# Patient Record
Sex: Female | Born: 1985 | Race: White | Hispanic: No | Marital: Single | Smoking: Never smoker
Health system: Southern US, Community
[De-identification: ages and names within clinical notes are randomized; demographics above are authoritative.]

## PROBLEM LIST (undated history)

## (undated) DIAGNOSIS — J45909 Unspecified asthma, uncomplicated: Secondary | ICD-10-CM

---

## 2014-10-24 ENCOUNTER — Other Ambulatory Visit (HOSPITAL_BASED_OUTPATIENT_CLINIC_OR_DEPARTMENT_OTHER): Payer: Self-pay | Admitting: Family Medicine

## 2014-10-24 DIAGNOSIS — R198 Other specified symptoms and signs involving the digestive system and abdomen: Secondary | ICD-10-CM

## 2014-10-24 DIAGNOSIS — R109 Unspecified abdominal pain: Secondary | ICD-10-CM

## 2014-10-25 ENCOUNTER — Ambulatory Visit (HOSPITAL_BASED_OUTPATIENT_CLINIC_OR_DEPARTMENT_OTHER)
Admission: RE | Admit: 2014-10-25 | Discharge: 2014-10-25 | Disposition: A | Discharge status: Home / Self Care | Payer: BC Managed Care – PPO | Source: Ambulatory Visit | Attending: Family Medicine | Admitting: Family Medicine

## 2014-10-25 DIAGNOSIS — R198 Other specified symptoms and signs involving the digestive system and abdomen: Secondary | ICD-10-CM

## 2014-10-25 DIAGNOSIS — K5730 Diverticulosis of large intestine without perforation or abscess without bleeding: Secondary | ICD-10-CM | POA: Diagnosis not present

## 2014-10-25 DIAGNOSIS — K8020 Calculus of gallbladder without cholecystitis without obstruction: Secondary | ICD-10-CM | POA: Diagnosis not present

## 2014-10-25 DIAGNOSIS — R109 Unspecified abdominal pain: Secondary | ICD-10-CM | POA: Insufficient documentation

## 2014-10-25 MED ORDER — IOHEXOL 300 MG/ML  SOLN
100.0000 mL | Freq: Once | INTRAMUSCULAR | Status: AC | PRN
  Administered 2014-10-25: 100 mL via INTRAVENOUS

## 2014-11-15 ENCOUNTER — Emergency Department (HOSPITAL_BASED_OUTPATIENT_CLINIC_OR_DEPARTMENT_OTHER)
Admission: EM | Admit: 2014-11-15 | Discharge: 2014-11-16 | Disposition: A | Discharge status: Home / Self Care | Payer: BLUE CROSS/BLUE SHIELD | Attending: Emergency Medicine | Admitting: Emergency Medicine

## 2014-11-15 ENCOUNTER — Encounter (HOSPITAL_BASED_OUTPATIENT_CLINIC_OR_DEPARTMENT_OTHER): Payer: Self-pay | Admitting: *Deleted

## 2014-11-15 DIAGNOSIS — Z79899 Other long term (current) drug therapy: Secondary | ICD-10-CM | POA: Insufficient documentation

## 2014-11-15 DIAGNOSIS — Z3202 Encounter for pregnancy test, result negative: Secondary | ICD-10-CM | POA: Insufficient documentation

## 2014-11-15 DIAGNOSIS — R1011 Right upper quadrant pain: Secondary | ICD-10-CM

## 2014-11-15 HISTORY — DX: Unspecified asthma, uncomplicated: J45.909

## 2014-11-15 LAB — COMPREHENSIVE METABOLIC PANEL
ALBUMIN: 3.8 g/dL (ref 3.5–5.2)
ALT: 31 U/L (ref 0–35)
ANION GAP: 6 (ref 5–15)
AST: 22 U/L (ref 0–37)
Alkaline Phosphatase: 84 U/L (ref 39–117)
BUN: 6 mg/dL (ref 6–23)
CALCIUM: 8.5 mg/dL (ref 8.4–10.5)
CHLORIDE: 106 meq/L (ref 96–112)
CO2: 21 mmol/L (ref 19–32)
Creatinine, Ser: 0.54 mg/dL (ref 0.50–1.10)
GFR calc Af Amer: >90 mL/min (ref >90)
Glucose, Bld: 98 mg/dL (ref 70–99)
Potassium: 3.9 mmol/L (ref 3.5–5.1)
Sodium: 133 mmol/L — ABNORMAL LOW (ref 135–145)
Total Bilirubin: 1.2 mg/dL (ref 0.3–1.2)
Total Protein: 8.2 g/dL (ref 6.0–8.3)

## 2014-11-15 LAB — URINALYSIS, ROUTINE W REFLEX MICROSCOPIC
Bilirubin Urine: NEGATIVE
Glucose, UA: NEGATIVE mg/dL
Ketones, ur: NEGATIVE mg/dL
Nitrite: NEGATIVE
Protein, ur: NEGATIVE mg/dL
Specific Gravity, Urine: 1.012 (ref 1.005–1.030)
Urobilinogen, UA: 1 mg/dL (ref 0.0–1.0)
pH: 6 (ref 5.0–8.0)

## 2014-11-15 LAB — CBC WITH DIFFERENTIAL/PLATELET
BASOS ABS: 0.1 10*3/uL (ref 0.0–0.1)
BASOS PCT: 0 % (ref 0–1)
Eosinophils Absolute: 0.1 10*3/uL (ref 0.0–0.7)
Eosinophils Relative: 1 % (ref 0–5)
HEMATOCRIT: 39.3 % (ref 36.0–46.0)
Hemoglobin: 13.1 g/dL (ref 12.0–15.0)
LYMPHS PCT: 19 % (ref 12–46)
Lymphs Abs: 3.2 10*3/uL (ref 0.7–4.0)
MCH: 29 pg (ref 26.0–34.0)
MCHC: 33.3 g/dL (ref 30.0–36.0)
MCV: 87.1 fL (ref 78.0–100.0)
MONO ABS: 1.3 10*3/uL — AB (ref 0.1–1.0)
Monocytes Relative: 8 % (ref 3–12)
NEUTROS PCT: 72 % (ref 43–77)
Neutro Abs: 12 10*3/uL — ABNORMAL HIGH (ref 1.7–7.7)
Platelets: 329 10*3/uL (ref 150–400)
RBC: 4.51 MIL/uL (ref 3.87–5.11)
RDW: 12.8 % (ref 11.5–15.5)
WBC: 16.6 10*3/uL — ABNORMAL HIGH (ref 4.0–10.5)

## 2014-11-15 LAB — PREGNANCY, URINE: Preg Test, Ur: NEGATIVE

## 2014-11-15 LAB — URINE MICROSCOPIC-ADD ON

## 2014-11-15 LAB — LIPASE, BLOOD: LIPASE: 18 U/L (ref 11–59)

## 2014-11-15 MED ORDER — ONDANSETRON HCL 4 MG/2ML IJ SOLN: 4.0000 mg | Freq: Once | INTRAMUSCULAR | Status: DC

## 2014-11-15 MED ORDER — SODIUM CHLORIDE 0.9 % IV SOLN
1000.0000 mL | Freq: Once | INTRAVENOUS | Status: AC
  Administered 2014-11-15: 1000 mL via INTRAVENOUS

## 2014-11-15 NOTE — ED Notes (Signed)
Pt c/o right lower abd pain x 3 days CT scan with  contrast done 2 weeks ago

## 2014-11-16 ENCOUNTER — Inpatient Hospital Stay (HOSPITAL_BASED_OUTPATIENT_CLINIC_OR_DEPARTMENT_OTHER): Admit: 2014-11-16 | Payer: BLUE CROSS/BLUE SHIELD

## 2014-11-16 MED ORDER — OXYCODONE-ACETAMINOPHEN 5-325 MG PO TABS: 1.0000 | ORAL_TABLET | ORAL | Status: AC | PRN

## 2014-11-16 MED ORDER — ONDANSETRON HCL 4 MG PO TABS: 4.0000 mg | ORAL_TABLET | Freq: Four times a day (QID) | ORAL | Status: AC

## 2014-11-16 NOTE — ED Provider Notes (Signed)
CSN: [id number]     Arrival date & time 11/15/14  2147 History   First MD Initiated Contact with Patient 11/16/14 0114     Chief Complaint  Patient presents with  . Abdominal Pain     (Consider location/radiation/quality/duration/timing/severity/associated sxs/prior Treatment) Patient is a 29 y.o. female presenting with abdominal pain. The history is provided by the patient. No language interpreter was used.  Abdominal Pain Pain location:  RUQ Associated symptoms: no chills, no diarrhea, no dysuria, no fever, no nausea, no vaginal discharge and no vomiting   Associated symptoms comment:  Pain limited to RUQ abdomen that started 3-4 days ago. No N, V or fever. She has a known gall stone found incidentally on CT scan 3 weeks ago when being treated for abdominal wall cellulitis. She denies any alleviating or exacerbating factors, specifically the pain is no worse with eating.    Past Medical History  Diagnosis Date  . Asthma    History reviewed. No pertinent past surgical history. History reviewed. No pertinent family history. History  Substance Use Topics  . Smoking status: Never Smoker   . Smokeless tobacco: Not on file  . Alcohol Use: No   OB History    No data available     Review of Systems  Constitutional: Negative for fever and chills.  HENT: Negative.   Respiratory: Negative.   Cardiovascular: Negative.   Gastrointestinal: Positive for abdominal pain. Negative for nausea, vomiting and diarrhea.  Genitourinary: Negative.  Negative for dysuria and vaginal discharge.  Musculoskeletal: Negative.  Negative for back pain.  Skin: Negative.   Neurological: Negative.       Allergies  Review of patient's allergies indicates no known allergies.  Home Medications   Prior to Admission medications   Medication Sig Start Date End Date Taking? Authorizing Provider  levothyroxine (SYNTHROID, LEVOTHROID) 112 MCG tablet Take 112 mcg by mouth daily before breakfast.   Yes  Historical Provider, MD  metFORMIN (GLUCOPHAGE) 500 MG tablet Take 750 mg by mouth 2 (two) times daily with a meal.   Yes Historical Provider, MD  montelukast (SINGULAIR) 10 MG tablet Take 10 mg by mouth at bedtime.   Yes Historical Provider, MD   LMP 10/25/2014 Physical Exam  Constitutional: She is oriented to person, place, and time. She appears well-developed and well-nourished.  Neck: Normal range of motion.  Pulmonary/Chest: Effort normal.  Abdominal: She exhibits no mass.  ruq abdominal tenderness.  Neurological: She is alert and oriented to person, place, and time.  Skin: Skin is warm and dry.    ED Course  Procedures (including critical care time) Labs Review Labs Reviewed  URINALYSIS, ROUTINE W REFLEX MICROSCOPIC - Abnormal; Notable for the following:    Color, Urine AMBER (*)    APPearance CLOUDY (*)    Hgb urine dipstick SMALL (*)    Leukocytes, UA MODERATE (*)    All other components within normal limits  URINE MICROSCOPIC-ADD ON - Abnormal; Notable for the following:    Squamous Epithelial / LPF MANY (*)    Bacteria, UA MANY (*)    All other components within normal limits  CBC WITH DIFFERENTIAL - Abnormal; Notable for the following:    WBC 16.6 (*)    Neutro Abs 12.0 (*)    Monocytes Absolute 1.3 (*)    All other components within normal limits  COMPREHENSIVE METABOLIC PANEL - Abnormal; Notable for the following:    Sodium 133 (*)    All other components within normal limits  URINE CULTURE  PREGNANCY, URINE  LIPASE, BLOOD    Imaging Review No results found.   EKG Interpretation None      MDM   Final diagnoses:  None    1. Abdominal pain 2. History of gall stones  No fever, no change in LFT's. She has a leukocytosis without shift. She is currently comfortable. She will need an ultrasound which is not available here tonight but feel she is stable enough for outpatient study in am. Will schedule study and she is in agreement to return for study.  Return precautions discussed.    Arnoldo HookerShari A Amaris Delafuente, PA-C 11/16/14 0140  Hanley SeamenJohn L Molpus, MD 11/16/14 [phone]

## 2014-11-16 NOTE — Discharge Instructions (Signed)
Abdominal Pain  Many things can cause abdominal pain. Usually, abdominal pain is not caused by a disease and will improve without treatment. It can often be observed and treated at home. Your health care provider will do a physical exam and possibly order blood tests and X-rays to help determine the seriousness of your pain. However, in many cases, more time must pass before a clear cause of the pain can be found. Before that point, your health care provider may not know if you need more testing or further treatment.  HOME CARE INSTRUCTIONS   Monitor your abdominal pain for any changes. The following actions may help to alleviate any discomfort you are experiencing:   Only take over-the-counter or prescription medicines as directed by your health care provider.   Do not take laxatives unless directed to do so by your health care provider.   Try a clear liquid diet (broth, tea, or water) as directed by your health care provider. Slowly move to a bland diet as tolerated.  SEEK MEDICAL CARE IF:   You have unexplained abdominal pain.   You have abdominal pain associated with nausea or diarrhea.   You have pain when you urinate or have a bowel movement.   You experience abdominal pain that wakes you in the night.   You have abdominal pain that is worsened or improved by eating food.   You have abdominal pain that is worsened with eating fatty foods.   You have a fever.  SEEK IMMEDIATE MEDICAL CARE IF:    Your pain does not go away within 2 hours.   You keep throwing up (vomiting).   Your pain is felt only in portions of the abdomen, such as the right side or the left lower portion of the abdomen.   You pass bloody or black tarry stools.  MAKE SURE YOU:   Understand these instructions.    Will watch your condition.    Will get help right away if you are not doing well or get worse.   Document Released: 07/22/2005 Document Revised: 10/17/2013 Document Reviewed: 06/21/2013  [employer] Patient Information  2015 [employer]. This information is not intended to replace advice given to you by your health care provider. Make sure you discuss any questions you have with your health care provider.          Biliary Colic   Biliary colic is a steady or irregular pain in the upper abdomen. It is usually under the right side of the rib cage. It happens when gallstones interfere with the normal flow of bile from the gallbladder. Bile is a liquid that helps to digest fats. Bile is made in the liver and stored in the gallbladder. When you eat a meal, bile passes from the gallbladder through the cystic duct and the common bile duct into the small intestine. There, it mixes with partially digested food. If a gallstone blocks either of these ducts, the normal flow of bile is blocked. The muscle cells in the bile duct contract forcefully to try to move the stone. This causes the pain of biliary colic.   SYMPTOMS    A person with biliary colic usually complains of pain in the upper abdomen. This pain can be:   In the center of the upper abdomen just below the breastbone.   In the upper-right part of the abdomen, near the gallbladder and liver.   Spread back toward the right shoulder blade.   Nausea and vomiting.   The pain   usually occurs after eating.   Biliary colic is usually triggered by the digestive system's demand for bile. The demand for bile is high after fatty meals. Symptoms can also occur when a person who has been fasting suddenly eats a very large meal. Most episodes of biliary colic pass after 1 to 5 hours. After the most intense pain passes, your abdomen may continue to ache mildly for about 24 hours.  DIAGNOSIS   After you describe your symptoms, your caregiver will perform a physical exam. He or she will pay attention to the upper right portion of your belly (abdomen). This is the area of your liver and gallbladder. An ultrasound will help your caregiver look for gallstones. Specialized scans of the  gallbladder may also be done. Blood tests may be done, especially if you have fever or if your pain persists.  PREVENTION   Biliary colic can be prevented by controlling the risk factors for gallstones. Some of these risk factors, such as heredity, increasing age, and pregnancy are a normal part of life. Obesity and a high-fat diet are risk factors you can change through a healthy lifestyle. Women going through menopause who take hormone replacement therapy (estrogen) are also more likely to develop biliary colic.  TREATMENT    Pain medication may be prescribed.   You may be encouraged to eat a fat-free diet.   If the first episode of biliary colic is severe, or episodes of colic keep retuning, surgery to remove the gallbladder (cholecystectomy) is usually recommended. This procedure can be done through small incisions using an instrument called a laparoscope. The procedure often requires a brief stay in the hospital. Some people can leave the hospital the same day. It is the most widely used treatment in people troubled by painful gallstones. It is effective and safe, with no complications in more than 90% of cases.   If surgery cannot be done, medication that dissolves gallstones may be used. This medication is expensive and can take months or years to work. Only small stones will dissolve.   Rarely, medication to dissolve gallstones is combined with a procedure called shock-wave lithotripsy. This procedure uses carefully aimed shock waves to break up gallstones. In many people treated with this procedure, gallstones form again within a few years.  PROGNOSIS   If gallstones block your cystic duct or common bile duct, you are at risk for repeated episodes of biliary colic. There is also a 25% chance that you will develop a gallbladder infection(acute cholecystitis), or some other complication of gallstones within 10 to 20 years. If you have surgery, schedule it at a time that is convenient for you and at a  time when you are not sick.  HOME CARE INSTRUCTIONS    Drink plenty of clear fluids.   Avoid fatty, greasy or fried foods, or any foods that make your pain worse.   Take medications as directed.  SEEK MEDICAL CARE IF:    You develop a fever over 100.5 F (38.1 C).   Your pain gets worse over time.   You develop nausea that prevents you from eating and drinking.   You develop vomiting.  SEEK IMMEDIATE MEDICAL CARE IF:    You have continuous or severe belly (abdominal) pain which is not relieved with medications.   You develop nausea and vomiting which is not relieved with medications.   You have symptoms of biliary colic and you suddenly develop a fever and shaking chills. This may signal cholecystitis. Call your caregiver   immediately.   You develop a yellow color to your skin or the white part of your eyes (jaundice).  Document Released: 03/15/2006 Document Revised: 01/04/2012 Document Reviewed: 05/24/2008  [employer] Patient Information 2015 [employer]. This information is not intended to replace advice given to you by your health care provider. Make sure you discuss any questions you have with your health care provider.

## 2014-11-17 LAB — URINE CULTURE: Colony Count: 40000

## 2014-11-19 ENCOUNTER — Other Ambulatory Visit (HOSPITAL_BASED_OUTPATIENT_CLINIC_OR_DEPARTMENT_OTHER): Payer: Self-pay | Admitting: Emergency Medicine

## 2014-11-19 ENCOUNTER — Ambulatory Visit (HOSPITAL_BASED_OUTPATIENT_CLINIC_OR_DEPARTMENT_OTHER)
Admission: RE | Admit: 2014-11-19 | Discharge: 2014-11-19 | Disposition: A | Discharge status: Home / Self Care | Payer: BLUE CROSS/BLUE SHIELD | Source: Ambulatory Visit | Attending: Emergency Medicine | Admitting: Emergency Medicine

## 2014-11-19 DIAGNOSIS — R932 Abnormal findings on diagnostic imaging of liver and biliary tract: Secondary | ICD-10-CM | POA: Diagnosis not present

## 2014-11-19 DIAGNOSIS — R1011 Right upper quadrant pain: Secondary | ICD-10-CM | POA: Diagnosis present

## 2014-11-19 DIAGNOSIS — R109 Unspecified abdominal pain: Secondary | ICD-10-CM

## 2014-11-19 DIAGNOSIS — K8020 Calculus of gallbladder without cholecystitis without obstruction: Secondary | ICD-10-CM | POA: Diagnosis not present

## 2014-11-27 ENCOUNTER — Other Ambulatory Visit (INDEPENDENT_AMBULATORY_CARE_PROVIDER_SITE_OTHER): Payer: Self-pay | Admitting: Surgery

## 2014-11-27 NOTE — H&P (Signed)
Amy Munoz 11/27/2014 4:20 PM Location: [facility] Patient #: [id number] DOB: [birth year] Single / Language: [place] / Race: White Female History of Present Illness Amy Munoz(Amy Munoz C. Dawit Tankard MD; 11/27/2014 5:41 PM) Patient words: gallbladder.  The patient is a 29 year old female who presents for evaluation of gall stones. [facility] by [facility] room physician, Amy Munoz. Concern for symptomatic gallstones. Pleasant morbidly obese female. Awoke with sharp sided right upper quadrant abdominal pain. Persisted. Went to emergency room 2 days later. Elevated white count. This of workup otherwise negative. Ultrasound done 2 days later. Showed some gallbladder wall thickening and confirmed a very large gallstone. She had a CAT scan done the month before for concern of some drainage at her bellybutton. Large gallstone noted then. No cholecystitis then. Because of lack of other differential diagnosis, surgical consultation made for possible chronic cholecystitis.   She says she normally can eat most anything she wants. Mildly decreased appetite but no severe nausea or vomiting. Denies heartburn or reflux. No Crohn's. No ulcerative colitis. No sick contacts. No travel history. No hematochezia. No melena. No hematemesis. No history of hepatitis or pancreatitis. Can walk 3 miles without any difficulty. She's never had abdominal surgery. Normally has a bowel movement every day. No family history of bowel issues.    CLINICAL DATA: Six day history of right upper quadrant pain EXAM: ULTRASOUND ABDOMEN COMPLETE COMPARISON: CT abdomen and pelvis October 25, 2014 FINDINGS: Gallbladder: Within the gallbladder, there is a 2.4 cm echogenic focus which shadows, adherent in the neck of the gallbladder. Sludge is also noted in the gallbladder. The gallbladder wall is thickened measuring 5 mm. There is no appreciable pericholecystic fluid. No sonographic Murphy  sign noted. Common bile duct: Diameter: 4 mm. There is no intrahepatic, common hepatic, or common bile duct dilatation. Liver: No focal lesion identified. Liver echogenicity is increased with overall inhomogeneity to the echotexture of the liver. IVC: No abnormality visualized. Pancreas: Visualized portion unremarkable. Portions of pancreas are obscured by gas. Spleen: Size and appearance within normal limits. Right Kidney: Length: 11.6 cm. Echogenicity within normal limits. No mass or hydronephrosis visualized. Left Kidney: Length: 11.2 cm. Echogenicity within normal limits. No mass or hydronephrosis visualized. Abdominal aorta: No aneurysm visualized. Other findings: No demonstrable ascites. IMPRESSION: Cholelithiasis and sludge in gallbladder. Gallbladder wall thickened. Suspect a degree of acute cholecystitis. Portions of pancreas obscured by gas. Visualized portions of pancreas appear normal. Liver echogenicity is increased and somewhat inhomogeneous. Suspect hepatic steatosis. While no focal liver lesions are identified, it must be cautioned that the sensitivity of ultrasound for focal liver lesions is diminished in this circumstance. Electronically Signed By: Amy Munoz. On: 11/19/2014 09:10  Other Problems Amy Munoz(Amy Munoz, CMA; 11/27/2014 4:20 PM) Asthma Cholelithiasis  Past Surgical History Amy Munoz(Amy Munoz, CMA; 11/27/2014 4:20 PM) Oral Surgery  Diagnostic Studies History Amy Munoz(Amy Munoz, CMA; 11/27/2014 4:20 PM) Colonoscopy never Mammogram never Pap Smear 1-5 years ago  Allergies Amy Munoz(Amy Munoz, CMA; 11/27/2014 4:21 PM) No Known Drug Allergies 11/27/2014  Medication History (Amy Munoz, CMA; 11/27/2014 4:22 PM) Synthroid (112MCG Tablet, Oral) Active. Montelukast Sodium (10MG  Tablet, Oral) Active. Zolpidem Tartrate (10MG  Tablet, Oral) Active.  Social History Amy Munoz(Amy Munoz, CMA; 11/27/2014 4:20 PM) Alcohol use Occasional alcohol use. No drug  use Tobacco use Never smoker.  Family History Amy Munoz(Amy Munoz, CMA; 11/27/2014 4:20 PM) Depression Sister. Hypertension Father.  Pregnancy / Birth History Amy Munoz(Amy Munoz, CMA; 11/27/2014 4:20 PM) Age at menarche 12 years. Contraceptive History Oral contraceptives.  Gravida 0 Para 0 Regular periods     Review of Systems (Amy Munoz CMA; 11/27/2014 4:20 PM) General Not Present- Appetite Loss, Chills, Fatigue, Fever, Night Sweats, Weight Gain and Weight Loss. Skin Present- Dryness. Not Present- Change in Wart/Mole, Hives, Jaundice, New Lesions, Non-Healing Wounds, Rash and Ulcer. HEENT Present- Seasonal Allergies. Not Present- Earache, Hearing Loss, Hoarseness, Nose Bleed, Oral Ulcers, Ringing in the Ears, Sinus Pain, Sore Throat, Visual Disturbances, Wears glasses/contact lenses and Yellow Eyes. Respiratory Not Present- Bloody sputum, Chronic Cough, Difficulty Breathing, Snoring and Wheezing. Breast Not Present- Breast Mass, Breast Pain, Nipple Discharge and Skin Changes. Cardiovascular Not Present- Chest Pain, Difficulty Breathing Lying Down, Leg Cramps, Palpitations, Rapid Heart Rate, Shortness of Breath and Swelling of Extremities. Gastrointestinal Not Present- Abdominal Pain, Bloating, Bloody Stool, Change in Bowel Habits, Chronic diarrhea, Constipation, Difficulty Swallowing, Excessive gas, Gets full quickly at meals, Hemorrhoids, Indigestion, Nausea, Rectal Pain and Vomiting. Female Genitourinary Not Present- Frequency, Nocturia, Painful Urination, Pelvic Pain and Urgency. Musculoskeletal Not Present- Back Pain, Joint Pain, Joint Stiffness, Muscle Pain, Muscle Weakness and Swelling of Extremities. Neurological Not Present- Decreased Memory, Fainting, Headaches, Numbness, Seizures, Tingling, Tremor, Trouble walking and Weakness. Psychiatric Not Present- Anxiety, Bipolar, Change in Sleep Pattern, Depression, Fearful and Frequent crying. Endocrine Not Present- Cold Intolerance, Excessive  Hunger, Hair Changes, Heat Intolerance, Hot flashes and New Diabetes. Hematology Not Present- Easy Bruising, Excessive bleeding, Gland problems, HIV and Persistent Infections.  Vitals (Amy Munoz CMA; 11/27/2014 4:21 PM) 11/27/2014 4:21 PM Weight: 263 lb Height: 64in Body Surface Area: 2.32 m Body Mass Index: 45.14 kg/m Temp.: 97.77F(Temporal)  Pulse: 78 (Regular)  BP: 128/76 (Sitting, Left Arm, Standard)     Physical Exam Amy Sportsman MD; 11/27/2014 5:02 PM)  General Mental Status-Alert. General Appearance-Not in acute distress, Not Sickly. Orientation-Oriented X3. Hydration-Well hydrated. Voice-Normal.  Integumentary Global Assessment Upon inspection and palpation of skin surfaces of the - Axillae: non-tender, no inflammation or ulceration, no drainage. and Distribution of scalp and body hair is normal. General Characteristics Temperature - normal warmth is noted.  Head and Neck Head-normocephalic, atraumatic with no lesions or palpable masses. Face Global Assessment - atraumatic, no absence of expression. Neck Global Assessment - no abnormal movements, no bruit auscultated on the right, no bruit auscultated on the left, no decreased range of motion, non-tender. Trachea-midline. Thyroid Gland Characteristics - non-tender.  Eye Eyeball - Left-Extraocular movements intact, No Nystagmus. Eyeball - Right-Extraocular movements intact, No Nystagmus. Cornea - Left-No Hazy. Cornea - Right-No Hazy. Sclera/Conjunctiva - Left-No scleral icterus, No Discharge. Sclera/Conjunctiva - Right-No scleral icterus, No Discharge. Pupil - Left-Direct reaction to light normal. Pupil - Right-Direct reaction to light normal.  ENMT Ears Pinna - Left - no drainage observed, no generalized tenderness observed. Right - no drainage observed, no generalized tenderness observed. Nose and Sinuses External Inspection of the Nose - no destructive lesion  observed. Inspection of the nares - Left - quiet respiration. Right - quiet respiration. Mouth and Throat Lips - Upper Lip - no fissures observed, no pallor noted. Lower Lip - no fissures observed, no pallor noted. Nasopharynx - no discharge present. Oral Cavity/Oropharynx - Tongue - no dryness observed. Oral Mucosa - no cyanosis observed. Hypopharynx - no evidence of airway distress observed.  Chest and Lung Exam Inspection Movements - Normal and Symmetrical. Accessory muscles - No use of accessory muscles in breathing. Palpation Palpation of the chest reveals - Non-tender. Auscultation Breath sounds - Normal and Clear.  Cardiovascular Auscultation Rhythm - Regular. Murmurs &  Other Heart Sounds - Auscultation of the heart reveals - No Murmurs and No Systolic Clicks.  Abdomen Inspection Inspection of the abdomen reveals - No Visible peristalsis and No Abnormal pulsations. Umbilicus - No Bleeding, No Urine drainage. Palpation/Percussion Palpation and Percussion of the abdomen reveal - Soft, Non Tender, No Rebound tenderness, No Rigidity (guarding) and No Cutaneous hyperesthesia. Note: Morbidly obese but soft. No abdominal discomfort. No evidence of cellulitis or irritation at umbilicus. No evidence of umbilical hernia.   Female Genitourinary Sexual Maturity Tanner 5 - Adult hair pattern. Note: No vaginal bleeding nor discharge. No inguinal hernias   Peripheral Vascular Upper Extremity Inspection - Left - No Cyanotic nailbeds, Not Ischemic. Right - No Cyanotic nailbeds, Not Ischemic.  Neurologic Neurologic evaluation reveals -normal attention span and ability to concentrate, able to name objects and repeat phrases. Appropriate fund of knowledge , normal sensation and normal coordination. Mental Status Affect - not angry, not paranoid. Cranial Nerves-Normal Bilaterally. Gait-Normal.  Neuropsychiatric Mental status exam performed with findings of-able to articulate  well with normal speech/language, rate, volume and coherence, thought content normal with ability to perform basic computations and apply abstract reasoning and no evidence of hallucinations, delusions, obsessions or homicidal/suicidal ideation.  Musculoskeletal Global Assessment Spine, Ribs and Pelvis - no instability, subluxation or laxity. Right Upper Extremity - no instability, subluxation or laxity.  Lymphatic Head & Neck  General Head & Neck Lymphatics: Bilateral - Description - No Localized lymphadenopathy. Axillary  General Axillary Region: Bilateral - Description - No Localized lymphadenopathy. Femoral & Inguinal  Generalized Femoral & Inguinal Lymphatics: Left - Description - No Localized lymphadenopathy. Right - Description - No Localized lymphadenopathy.    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 Very large gallstone with some gallbladder wall thickening. Had leukocytosis at that time. Pain resolved now. Still, not a classic description of biliary colic. However, differential diagnosis seems otherwise unlikely. Patient wants to avoid any other possible attacks in the aggressive and proceed with surgery. Reasonable to start with single site technique with possible shift to multiport.  The anatomy & physiology of hepatobiliary & pancreatic function was discussed. The pathophysiology of gallbladder dysfunction was discussed. Natural history risks without surgery was discussed. I feel the risks of no intervention will lead to serious problems that outweigh the operative risks; therefore, I recommended cholecystectomy to remove the pathology. I explained laparoscopic techniques with possible need for an open approach. Probable cholangiogram to evaluate the bilary tract was explained as well.  Risks such as bleeding, infection,  abscess, leak, injury to other organs, need for further treatment, heart attack, death, and other risks were discussed. I noted a good likelihood this will help address the problem. Possibility that this will not correct all abdominal symptoms was explained. Goals of post-operative recovery were discussed as well. We will work to minimize complications. An educational handout further explaining the pathology and treatment options was given as well. Questions were answered. The patient expresses understanding & wishes to proceed with surgery.  Current Plans Schedule for Surgery The anatomy & physiology of hepatobiliary & pancreatic function was discussed. The pathophysiology of gallbladder dysfunction was discussed. Natural history risks without surgery was discussed. I feel the risks of no intervention will lead to serious problems that outweigh the operative risks; therefore, I recommended cholecystectomy to remove the pathology. I explained laparoscopic techniques with possible need for an  open approach. Probable cholangiogram to evaluate the bilary tract was explained as well.  Risks such as bleeding, infection, abscess, leak, injury to other organs, need for further treatment, heart attack, death, and other risks were discussed. I noted a good likelihood this will help address the problem. Possibility that this will not correct all abdominal symptoms was explained. Goals of post-operative recovery were discussed as well. We will work to minimize complications. An educational handout further explaining the pathology and treatment options was given as well. Questions were answered. The patient expresses understanding & wishes to proceed with surgery. Pt Education - [facility] Laparosopic Post Op HCI (Ash Mcelwain) Pt Education - [facility] Pain Control (Adline Kirshenbaum) Pt Education - [facility] Good Bowel Health (Jezreel Sisk)  Amy Munoz, Munoz., F.A.C.S. Gastrointestinal and Minimally Invasive Surgery [facility] [address], [address] [place], [place] [address] [phone] Main / Paging

## 2015-10-09 IMAGING — CT CT ABD-PELV W/ CM
2 of 4 series · 16 of 46 positions shown, 18 images · IV contrast (omnipaque)
Comparison: None.

CLINICAL DATA: Abdominal pain primarily paraumbilical

EXAM:
CT ABDOMEN AND PELVIS WITH CONTRAST
TECHNIQUE: Multidetector CT imaging of the abdomen and pelvis was performed
using the standard protocol following bolus administration of
intravenous contrast.
CONTRAST:  100mL OMNIPAQUE IOHEXOL 300 MG/ML  SOLN

[Series 2: abd/pelvis 5.0 b31f · axial · 0.91mm/px · z∈[+800,+1220]mm · 13 of 92 slices shown, 15 images]
[im 4/92  soft-tissue]
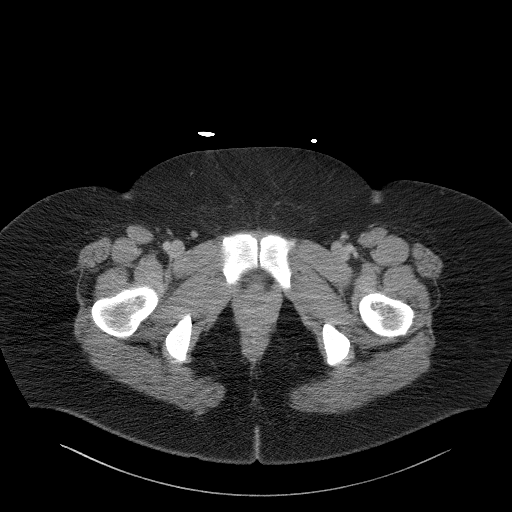
[im 4/92  bone]
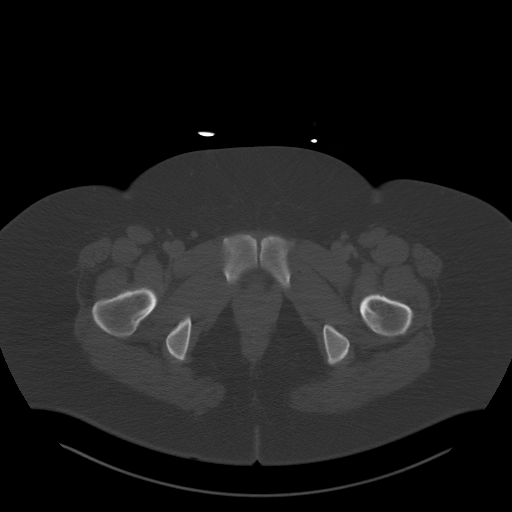
[im 12/92  soft-tissue]
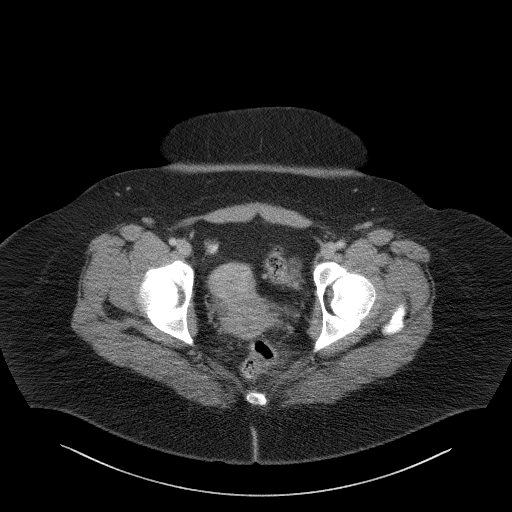
[im 19/92  soft-tissue]
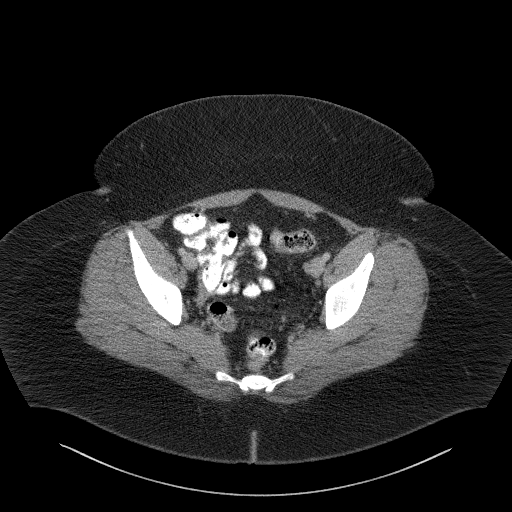
[im 27/92  soft-tissue]
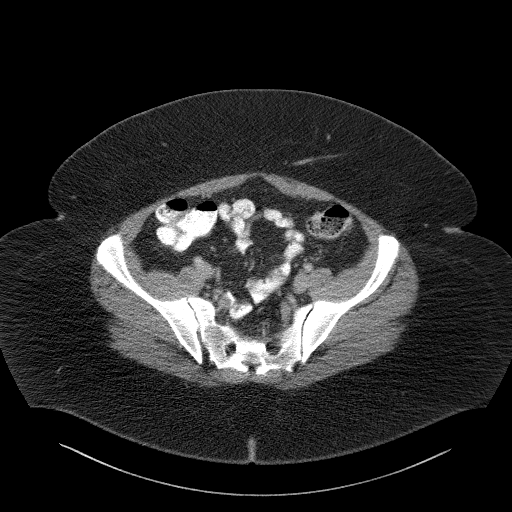
[im 31/92  soft-tissue]
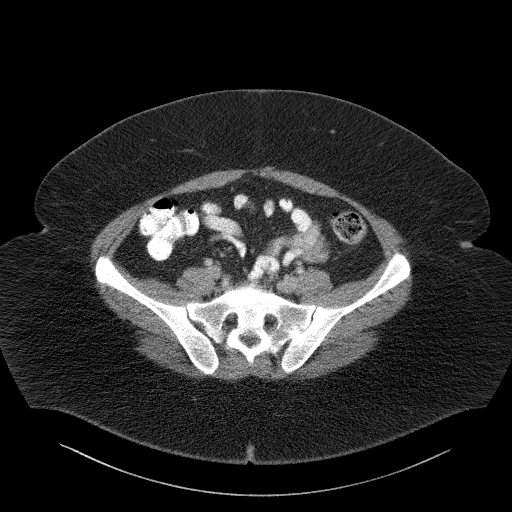
[im 38/92  soft-tissue]
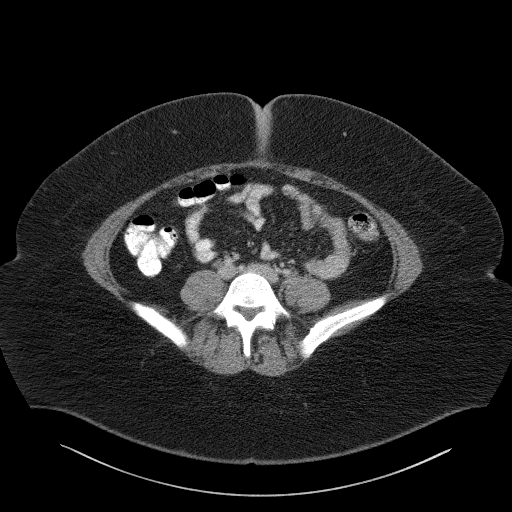
[im 46/92  soft-tissue]
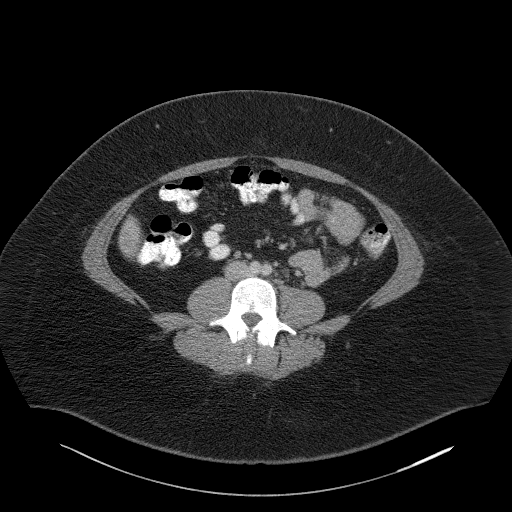
[im 54/92  soft-tissue]
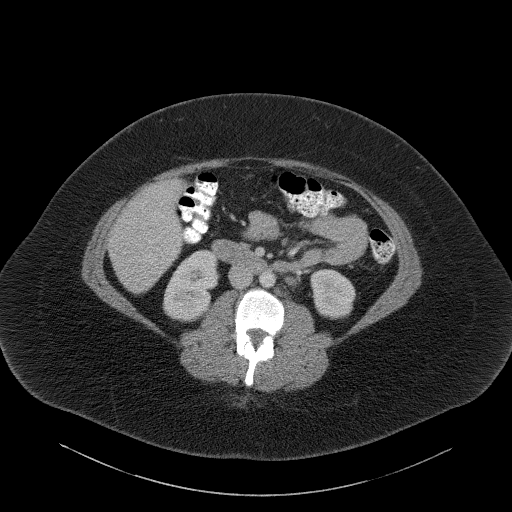
[im 61/92  soft-tissue]
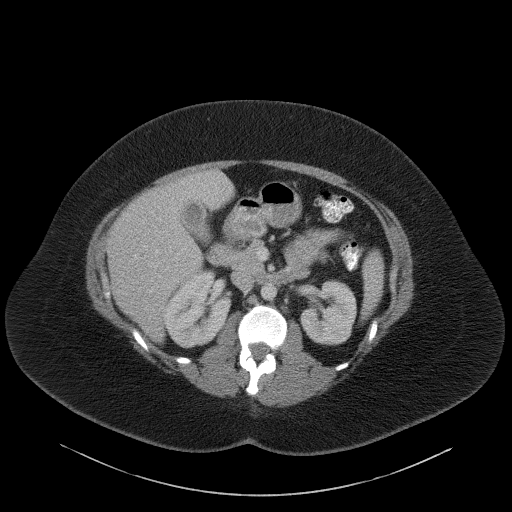
[im 61/92  bone]
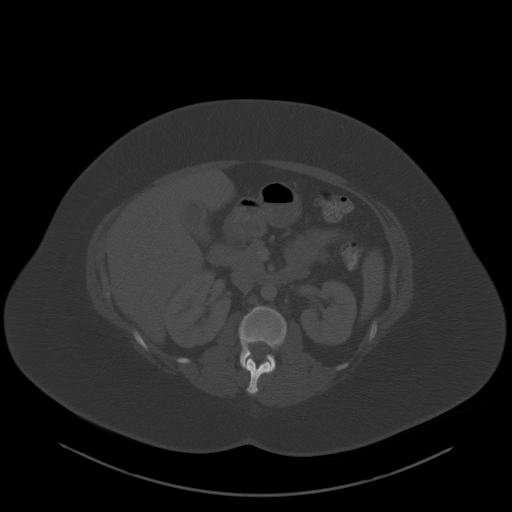
[im 65/92  soft-tissue]
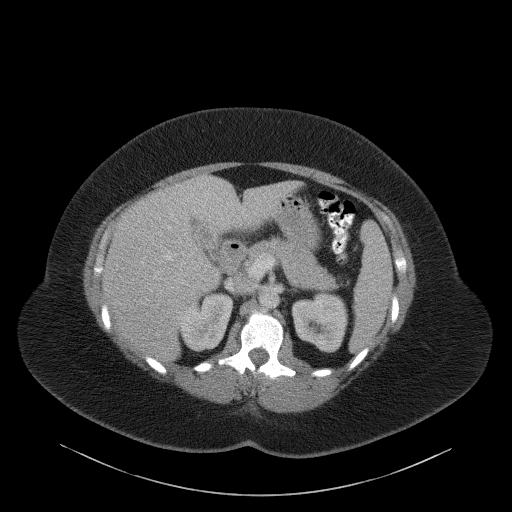
[im 73/92  soft-tissue]
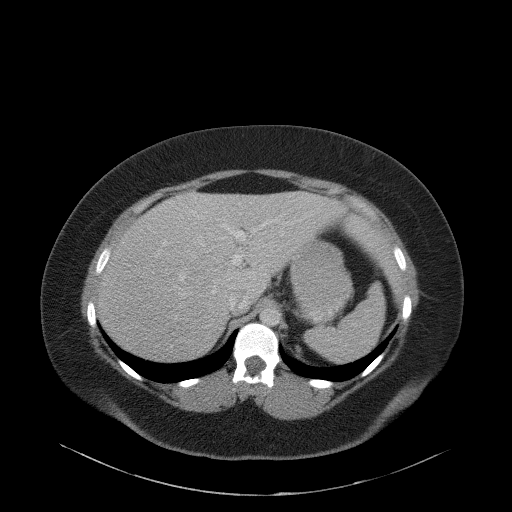
[im 80/92  soft-tissue]
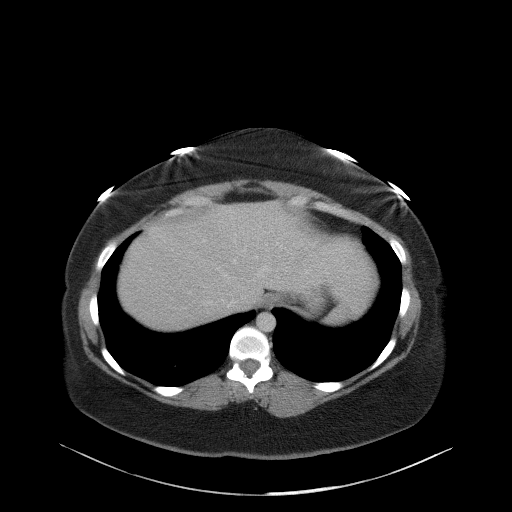
[im 88/92  soft-tissue]
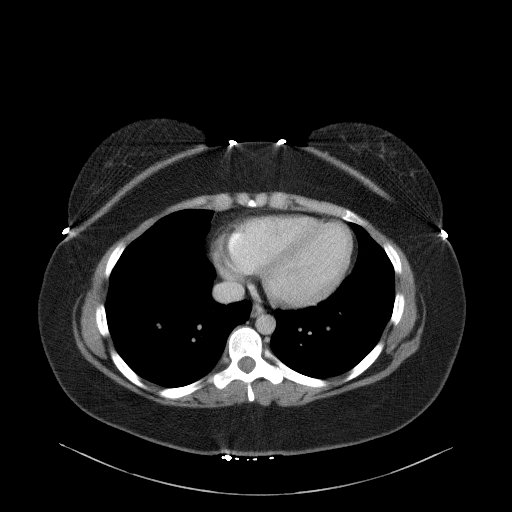

[Series 5: abd/pelvis 3.0 coronal · coronal · 0.90mm/px · 3 of 114 slices shown]
[im 38/114  soft-tissue]
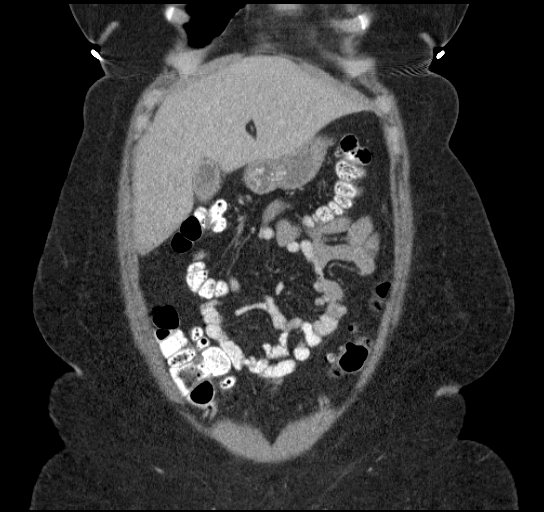
[im 51/114  soft-tissue]
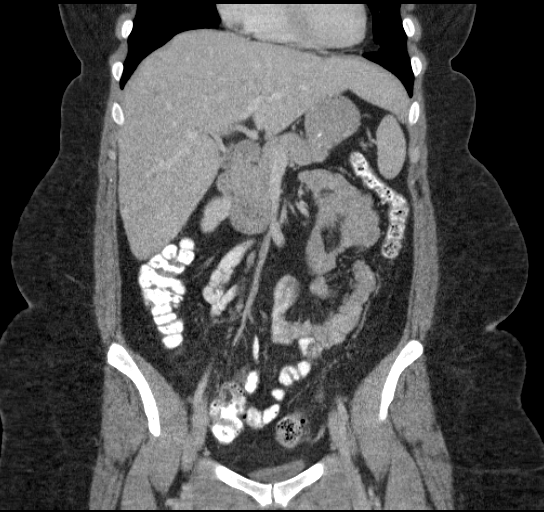
[im 63/114  soft-tissue]
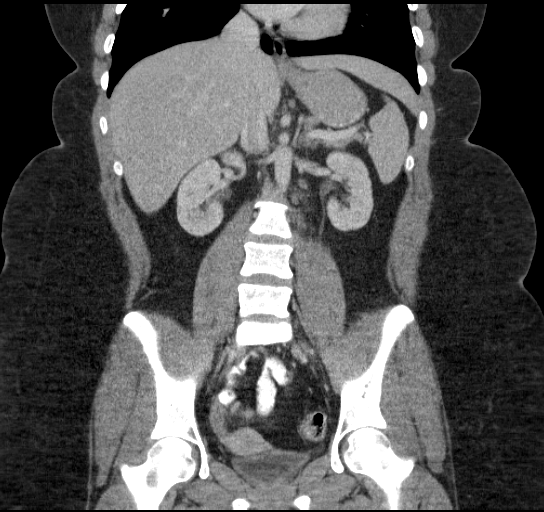

[16 of 46 positions shown; findings below may reference images not displayed]

FINDINGS: The lung bases are clear. The liver enhances and there is a probable
small hemangioma with peripheral enhancement in the periphery of the
right lobe near the dome. Within the gallbladder there does appear
to be a gallstone of approximately 2.7 cm in diameter. The
gallbladder wall is slightly prominent but the gallbladder is
nondistended. Clinical correlation is recommended. The pancreas is
normal in size and the pancreatic duct is not dilated. The adrenal
glands and spleen are unremarkable. The stomach is not well
distended. The kidneys enhance with no calculus or mass and no
hydronephrosis is noted. The abdominal aorta is normal in caliber.
No adenopathy is seen.

There is mild thickening of the skin around the umbilicus which
could indicate a local cellulitis. However no discrete abscess is
seen and no air is noted in the soft tissues. There is no evidence
of communication with bowel.

The urinary bladder is not well distended but no abnormality is
noted. The uterus is slightly to the right of midline. No fluid is
seen within the pelvis. Multiple rectosigmoid colon diverticula are
present. No diverticulitis is noted. The colon is largely
decompressed. The terminal ileum is unremarkable. The appendix
appears to course posteriorly, and it is normal in caliber.
IMPRESSION: 1. Slight soft tissue thickening near the umbilicus may indicate
cellulitis. No abscess or a communication with bowel is seen.
2. Apparent large gallstone of 2.7 cm in diameter. Correlate
clinically.
3. The appendix and terminal ileum are unremarkable.
4. Multiple rectosigmoid colon diverticula.

## 2015-11-03 IMAGING — US US ABDOMEN COMPLETE
1 series · 13 of 25 positions shown · non-contrast
Comparison: CT abdomen and pelvis October 25, 2014

CLINICAL DATA: Six day history of right upper quadrant pain

EXAM:
ULTRASOUND ABDOMEN COMPLETE

[Series 1: us abdomen complete · 0.31mm/px · 13 of 79 slices shown]
[im 1/79]
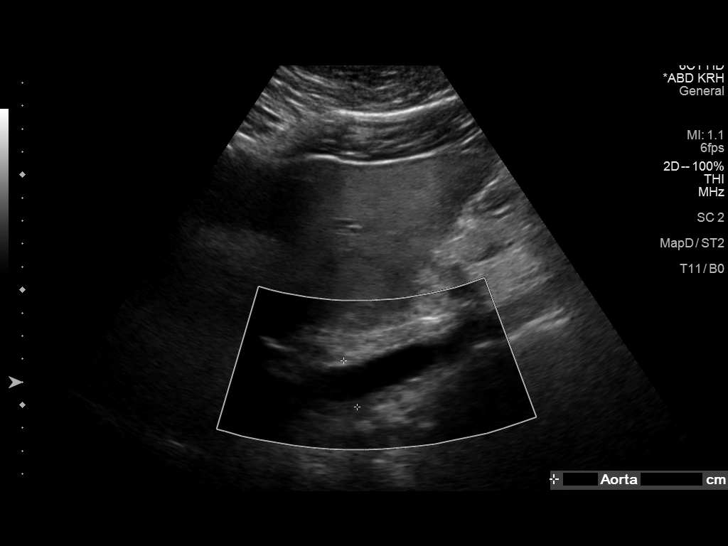
[im 7/79]
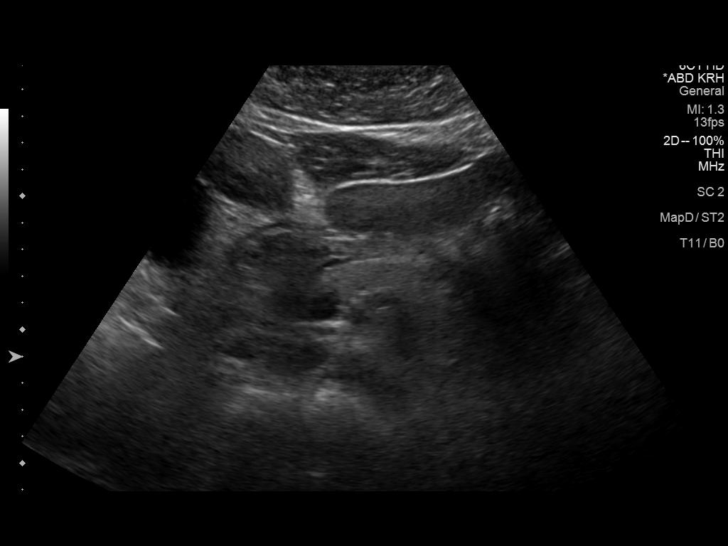
[im 14/79]
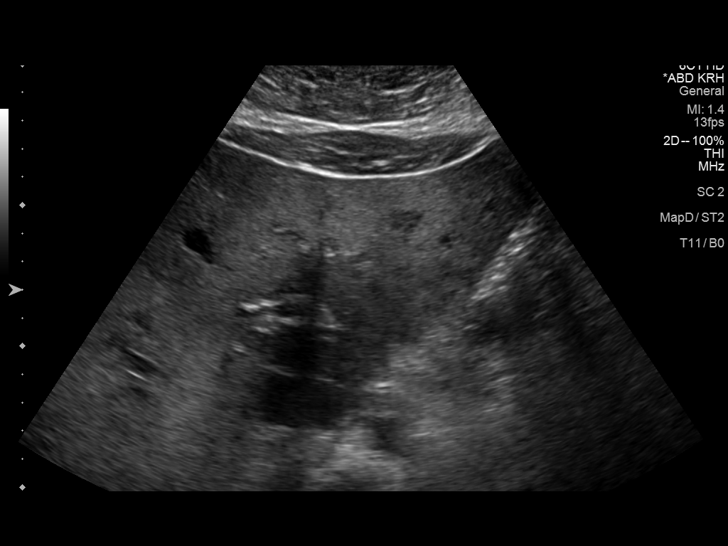
[im 20/79]
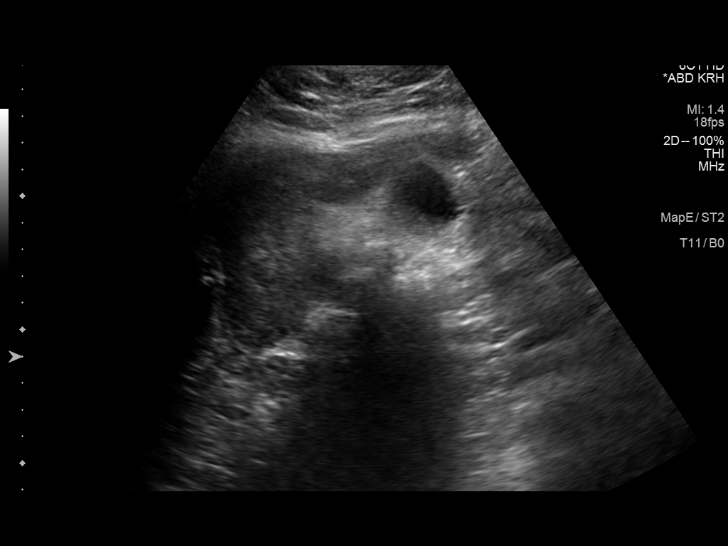
[im 27/79]
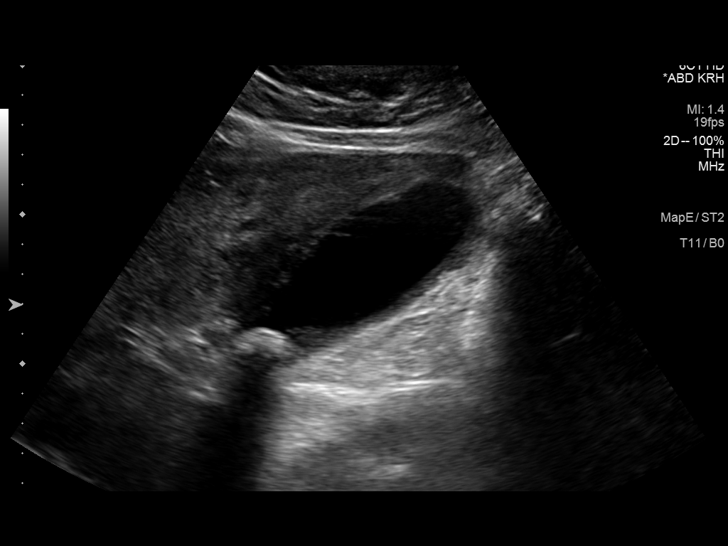
[im 33/79]
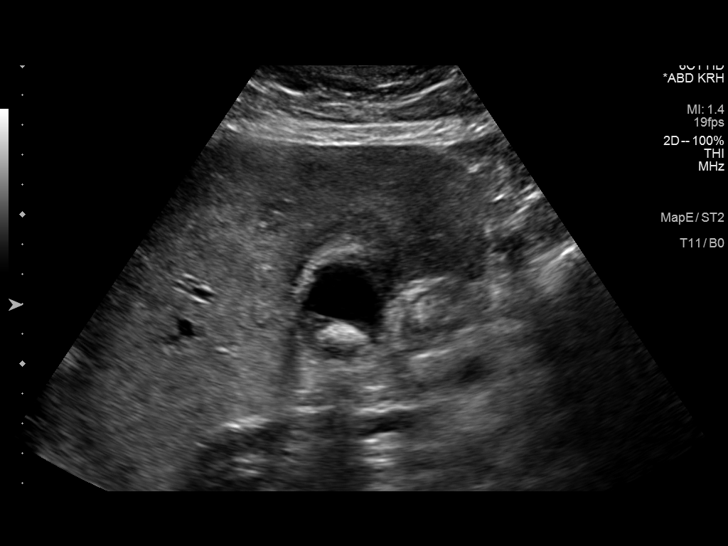
[im 40/79]
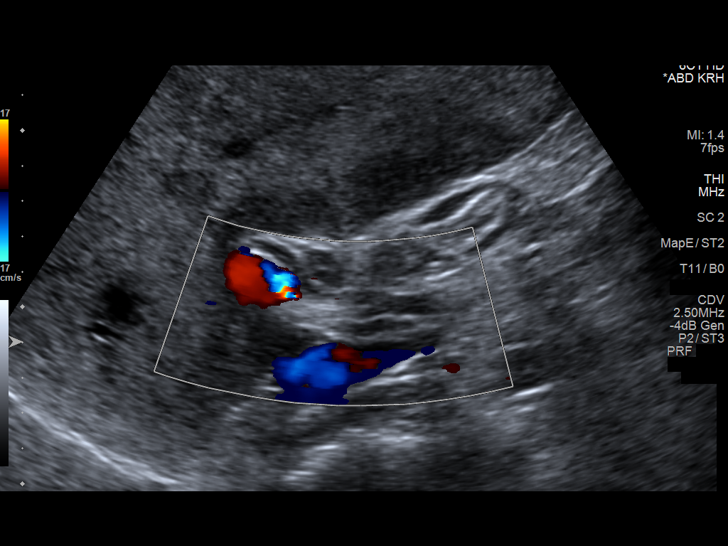
[im 46/79]
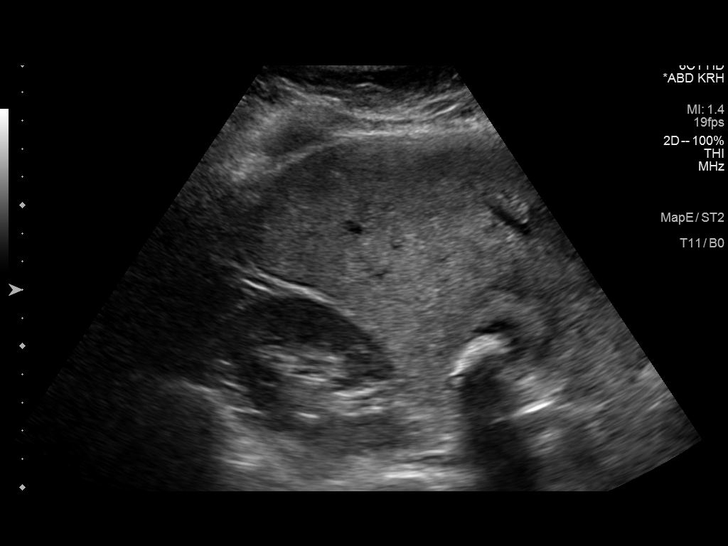
[im 53/79]
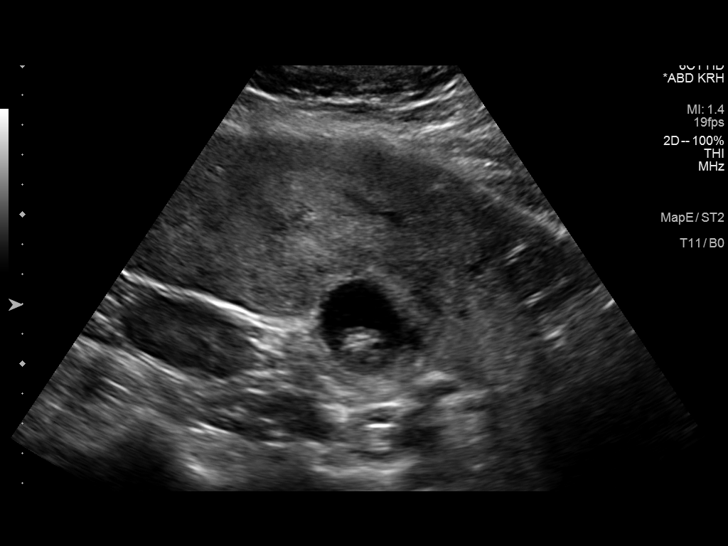
[im 59/79]
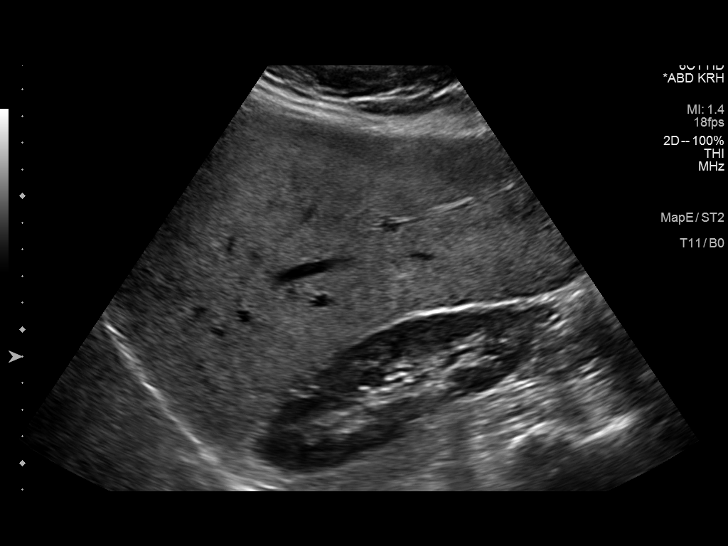
[im 66/79]
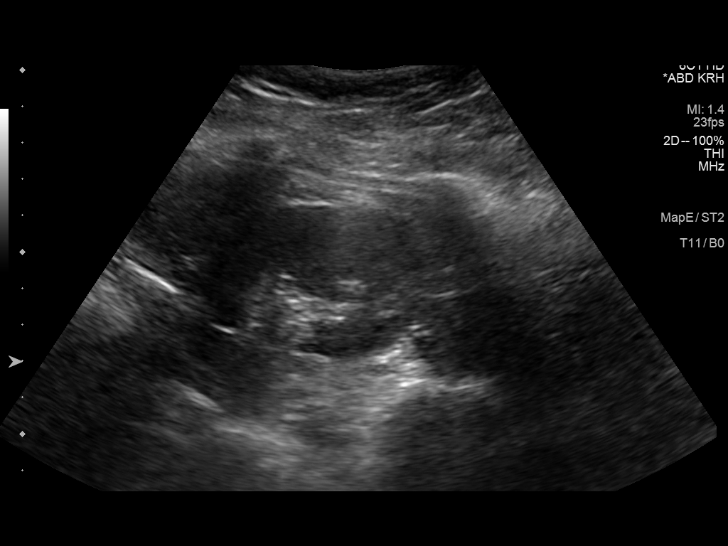
[im 72/79]
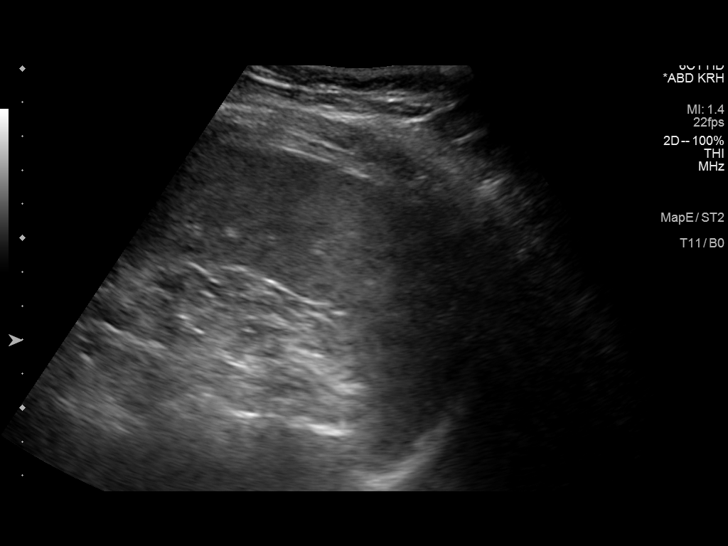
[im 79/79]
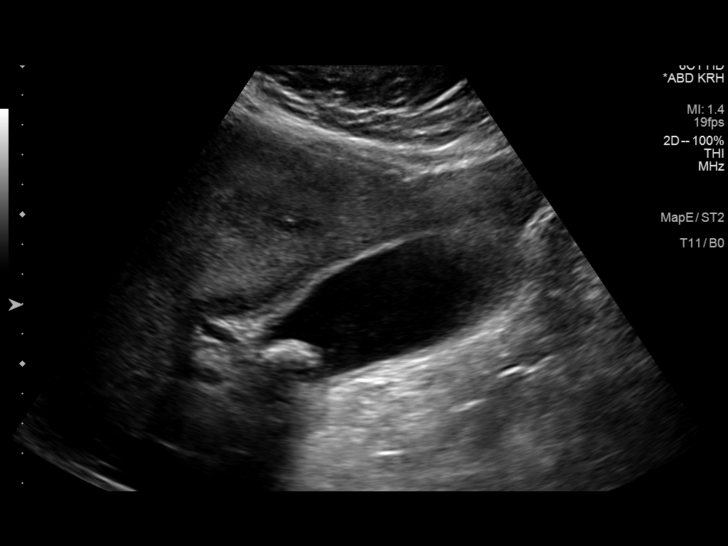

[13 of 25 positions shown; findings below may reference images not displayed]

FINDINGS: Gallbladder: Within the gallbladder, there is a 2.4 cm echogenic
focus which shadows, adherent in the neck of the gallbladder. Sludge
is also noted in the gallbladder. The gallbladder wall is thickened
measuring 5 mm. There is no appreciable pericholecystic fluid. No
sonographic Murphy sign noted.

Common bile duct: Diameter: 4 mm. There is no intrahepatic, common
hepatic, or common bile duct dilatation.

Liver: No focal lesion identified. Liver echogenicity is increased
with overall inhomogeneity to the echotexture of the liver.

IVC: No abnormality visualized.

Pancreas: Visualized portion unremarkable. Portions of pancreas are
obscured by gas.

Spleen: Size and appearance within normal limits.

Right Kidney: Length: 11.6 cm. Echogenicity within normal limits. No
mass or hydronephrosis visualized.

Left Kidney: Length: 11.2 cm. Echogenicity within normal limits. No
mass or hydronephrosis visualized.

Abdominal aorta: No aneurysm visualized.

Other findings: No demonstrable ascites.
IMPRESSION: Cholelithiasis and sludge in gallbladder. Gallbladder wall
thickened. Suspect a degree of acute cholecystitis.

Portions of pancreas obscured by gas. Visualized portions of
pancreas appear normal.

Liver echogenicity is increased and somewhat inhomogeneous. Suspect
hepatic steatosis. While no focal liver lesions are identified, it
must be cautioned that the sensitivity of ultrasound for focal liver
lesions is diminished in this circumstance.

## 2019-10-12 ENCOUNTER — Other Ambulatory Visit: Payer: Self-pay

## 2019-10-12 ENCOUNTER — Ambulatory Visit: Payer: BC Managed Care – PPO | Attending: Internal Medicine

## 2019-10-12 DIAGNOSIS — Z20822 Contact with and (suspected) exposure to covid-19: Secondary | ICD-10-CM

## 2019-10-13 LAB — NOVEL CORONAVIRUS, NAA: SARS-CoV-2, NAA: NOT DETECTED
